# Patient Record
Sex: Male | Born: 1990 | Race: Asian | Hispanic: No | Marital: Single | Smoking: Former smoker
Health system: Southern US, Community
[De-identification: ages and names within clinical notes are randomized; demographics above are authoritative.]

## PROBLEM LIST (undated history)

## (undated) DIAGNOSIS — T7840XA Allergy, unspecified, initial encounter: Secondary | ICD-10-CM

## (undated) HISTORY — DX: Allergy, unspecified, initial encounter: T78.40XA

---
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
## 2013-05-01 HISTORY — PX: URETERAL EXPLORATION: SHX2609

## 2017-10-10 ENCOUNTER — Ambulatory Visit: Payer: Managed Care, Other (non HMO) | Admitting: Surgery

## 2017-10-10 ENCOUNTER — Encounter: Payer: Self-pay | Admitting: Surgery

## 2017-10-10 DIAGNOSIS — R1031 Right lower quadrant pain: Secondary | ICD-10-CM

## 2017-10-10 NOTE — Patient Instructions (Signed)
CT abdomen and pelvis Wednesday 10-17-17 at 8:00 Nothing to eat or drink 4 hours before CT scan arrive at 7:[address] [facility] for 8:00 scan Follow up appointment as scheduled

## 2017-10-10 NOTE — Progress Notes (Signed)
Derrick NielsenFrank Castaneda is an 27 y.o. male.   Chief Complaint: Right lower quadrant abdominal pain Consult requested by [facility] HPI: This a patient who was sneezing a couple of months ago and noticed pain and bulge in his right lower quadrant.  He had an open appendectomy at age 27 and has quite a large scar in that area.  He has had no nausea vomiting fevers or chills and did not know that he had a hernia although he gas that that might be the case.  He works as a Presenter, broadcastingcarpet install coordinator.  He stopped smoking 6 months ago does not drink alcohol.  He has no family history of serious medical illnesses except diabetes.  Past Medical History:  Diagnosis Date  . Allergy    seasonal      Family History  Problem Relation Age of Onset  . Diabetes Maternal Grandmother   . Diabetes Paternal Grandmother   . Colon cancer Neg Hx    Social History:  reports that he quit smoking about 5 months ago. His smoking use included cigarettes. He has a 1.00 pack-year smoking history. He has never used smokeless tobacco. He reports that he drinks alcohol. He reports that he does not use drugs.  Allergies: No Known Allergies   (Not in a hospital admission)   Review of Systems:   Review of Systems  Constitutional: Negative.   HENT: Negative.   Eyes: Negative.   Respiratory: Negative.   Cardiovascular: Negative.   Gastrointestinal: Positive for abdominal pain. Negative for constipation, diarrhea, heartburn, nausea and vomiting.  Genitourinary: Negative.   Musculoskeletal: Negative.   Skin: Negative.   Neurological: Negative.   Endo/Heme/Allergies: Negative.   Psychiatric/Behavioral: Negative.     Physical Exam:  Physical Exam  Constitutional: He appears well-developed and well-nourished.  HENT:  Head: Normocephalic and atraumatic.  Abdominal: Normal appearance. He exhibits no ascites. There is no hepatosplenomegaly. There is tenderness in the right lower quadrant. There is no rigidity,  no guarding and no tenderness at McBurney's point.  Scarring right lower quadrant Patient examined supine and with Valsalva.  Question of a lateral hernia near the lateral extent of the scar. No palpable bulge but question of a hernial rent lateral.  Nontender  Vitals reviewed.   Blood pressure (!) 167/95, pulse 64, temperature 98.2 F (36.8 C), temperature source Oral, resp. rate 16, height 5\' 8"  (1.727 m), weight 215 lb (97.5 kg), SpO2 98 %.    No results found for this or any previous visit (from the past 48 hour(s)). No results found.   Assessment/Plan We will obtain CT scan to evaluate for possible hernia while the likelihood is high.  Lattie Hawichard E Cooper, MD, FACS

## 2017-10-17 ENCOUNTER — Ambulatory Visit
Admission: RE | Admit: 2017-10-17 | Discharge: 2017-10-17 | Disposition: A | Discharge status: Home / Self Care | Payer: Managed Care, Other (non HMO) | Source: Ambulatory Visit | Attending: Surgery | Admitting: Surgery

## 2017-10-17 DIAGNOSIS — R1031 Right lower quadrant pain: Secondary | ICD-10-CM

## 2017-10-17 MED ORDER — IOPAMIDOL (ISOVUE-300) INJECTION 61%
100.0000 mL | Freq: Once | INTRAVENOUS | Status: AC | PRN
  Administered 2017-10-17: 100 mL via INTRAVENOUS

## 2017-10-18 ENCOUNTER — Telehealth: Payer: Self-pay

## 2017-10-18 NOTE — Telephone Encounter (Signed)
Called patient but had to leave him a detailed message letting him know that his CT Scan did not show any abnormalities in his abdomen. However, I gave him the option to call us if he wanted to be seen by Dr. Excell Seltzerooper again to give him suggestions.

## 2017-10-23 ENCOUNTER — Ambulatory Visit (INDEPENDENT_AMBULATORY_CARE_PROVIDER_SITE_OTHER): Payer: Managed Care, Other (non HMO) | Admitting: Surgery

## 2017-10-23 VITALS — BP 150/88 | HR 59 | Temp 97.8°F | Ht 68.0 in | Wt 219.0 lb

## 2017-10-23 DIAGNOSIS — R1031 Right lower quadrant pain: Secondary | ICD-10-CM

## 2017-10-23 NOTE — Patient Instructions (Signed)
We will send a referral to [facility] [phone]. They will contact you with date and time of your appointment. Let them know that you would like [place] be [place] in [place].

## 2017-10-23 NOTE — Progress Notes (Signed)
Outpatient Surgical Follow Up  10/23/2017  Derrick Castaneda is an 27 y.o. male.   CC: Right lower quadrant abdominal pain  HPI: This a patient who had a an open appendectomy as a child who presents with right lower quadrant abdominal pain somewhat suggestive of a ventral hernia.  CT scan has been personally reviewed and fails to identify any hernia.  Also failed to identify any other abnormalities. Patient points to his appendectomy incision as the site of his pain.  He states sometimes it radiates up to his right upper quadrant near his ribs.  He has nausea but no emesis.  Past Medical History:  Diagnosis Date  . Allergy    seasonal      Family History  Problem Relation Age of Onset  . Diabetes Maternal Grandmother   . Diabetes Paternal Grandmother   . Colon cancer Neg Hx     Social History:  reports that he quit smoking about 5 months ago. His smoking use included cigarettes. He has a 1.00 pack-year smoking history. He has never used smokeless tobacco. He reports that he drinks alcohol. He reports that he does not use drugs.  Allergies: No Known Allergies  Medications reviewed.   Review of Systems:   Review of Systems  Constitutional: Negative.   Eyes: Negative.   Respiratory: Negative.   Cardiovascular: Negative.   Gastrointestinal: Positive for abdominal pain and nausea. Negative for constipation, diarrhea and vomiting.  Genitourinary: Negative.   Musculoskeletal: Negative.   Skin: Negative.      Physical Exam:  There were no vitals taken for this visit.  Physical Exam  Constitutional: He appears well-developed and well-nourished. No distress.  Pulmonary/Chest: Effort normal. No respiratory distress.  Abdominal: Soft. He exhibits no distension and no mass. There is no tenderness. There is no rebound and no guarding. No hernia.  Patient examined supine and with Valsalva.  No sign of obvious hernia and no tenderness in the appendectomy incision or in the right  upper quadrant.  Skin: Skin is warm and dry. He is not diaphoretic. No erythema.      No results found for this or any previous visit (from the past 48 hour(s)). No results found.  Assessment/Plan:  CT scan was personally reviewed.  There is no evidence of hernia.  No other abnormalities noted on the CT scan with contrast.  I see no clear evidence for a hernia although his history was suggestive of such.  I would like to obtain a colonoscopy and will ask him to see GI for consultation concerning possible colonoscopy.  Lattie Hawichard E Cooper, MD, FACS

## 2019-09-01 IMAGING — CT CT ABD-PELV W/ CM
2 of 4 series · 16 of 46 positions shown, 18 images · IV contrast (iopamidol)
Comparison: None.

CLINICAL DATA: Worsening right lower quadrant pain for 1 month

EXAM:
CT ABDOMEN AND PELVIS WITH CONTRAST
TECHNIQUE: Multidetector CT imaging of the abdomen and pelvis was performed
using the standard protocol following bolus administration of
intravenous contrast.
CONTRAST:  100mL SRYED1-ARR IOPAMIDOL (SRYED1-ARR) INJECTION 61%

[Series 2: abd pelvis · axial · 0.70mm/px · z∈[-1624,-1174]mm · 13 of 100 slices shown, 15 images (1 of 2)]
[im 5/100  soft-tissue]
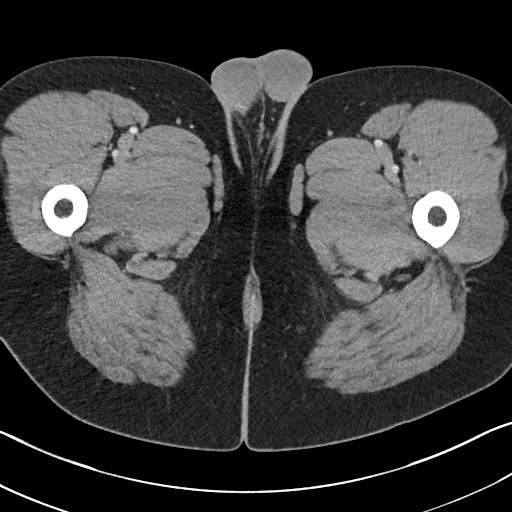
[im 5/100  bone]
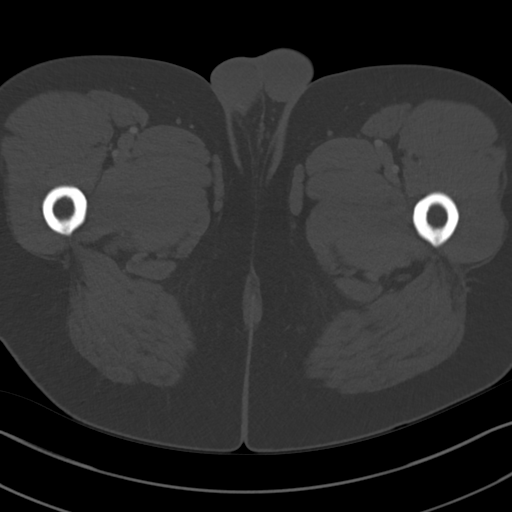
[im 13/100  soft-tissue]
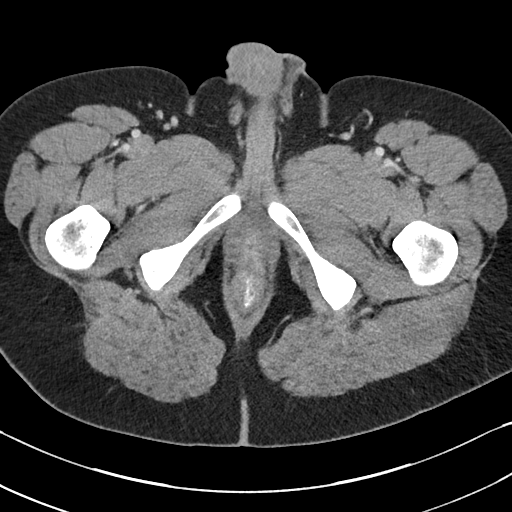
[im 21/100  soft-tissue]
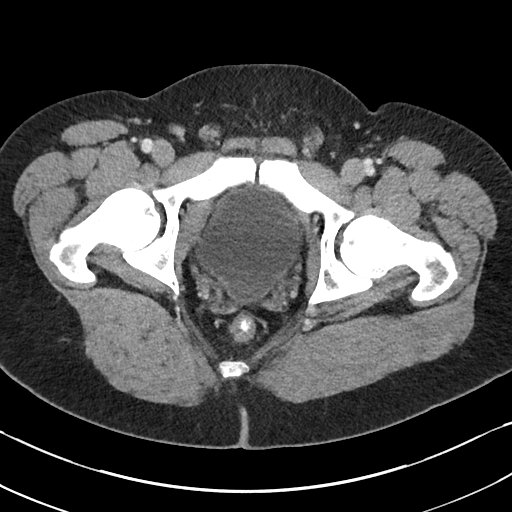
[im 29/100  soft-tissue]
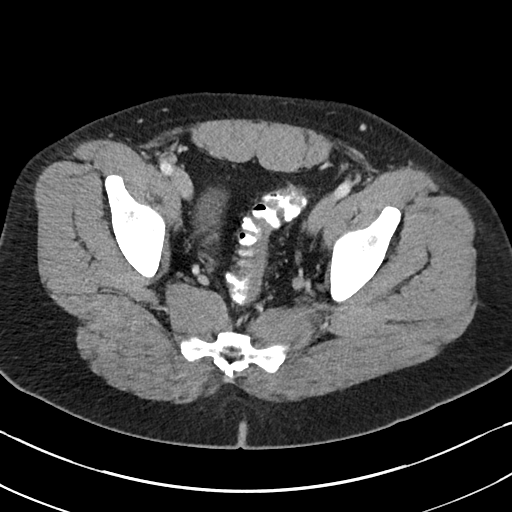
[im 34/100  soft-tissue]
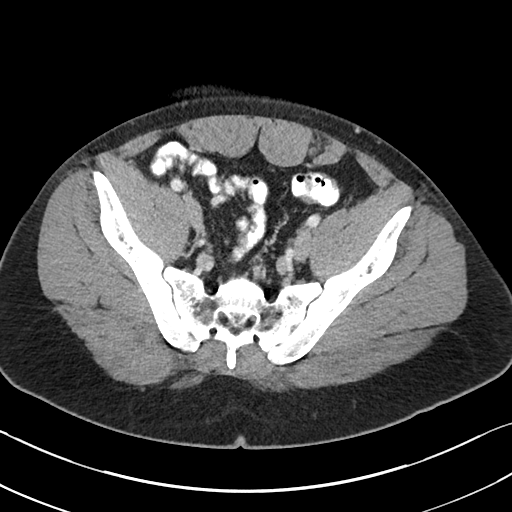
[im 42/100  soft-tissue]
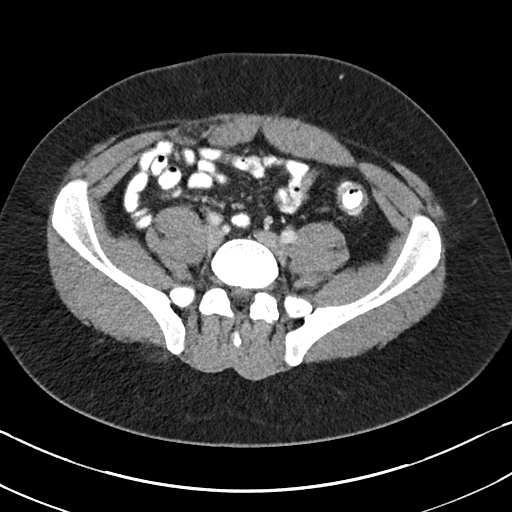
[im 50/100  soft-tissue]
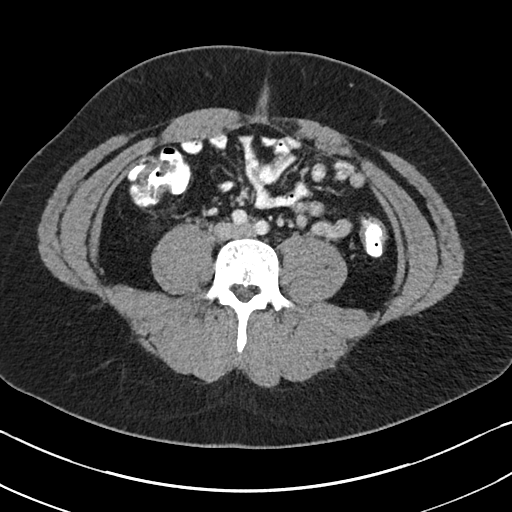
[im 58/100  soft-tissue]
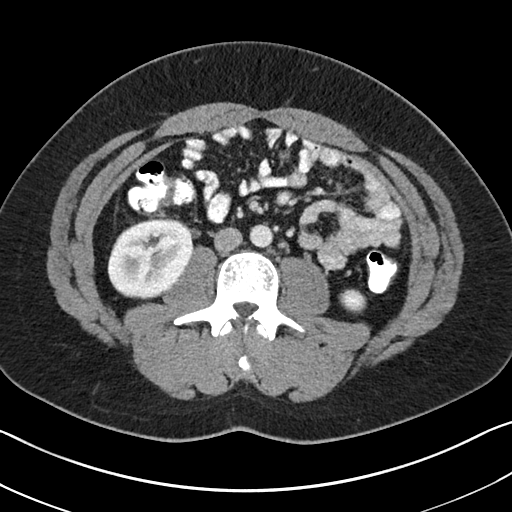
[im 67/100  soft-tissue]
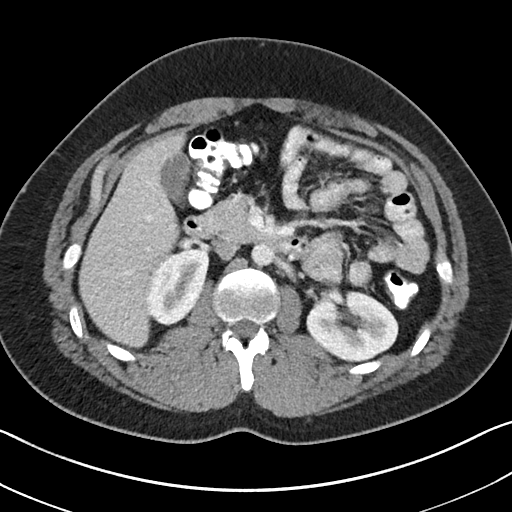
[im 67/100  bone]
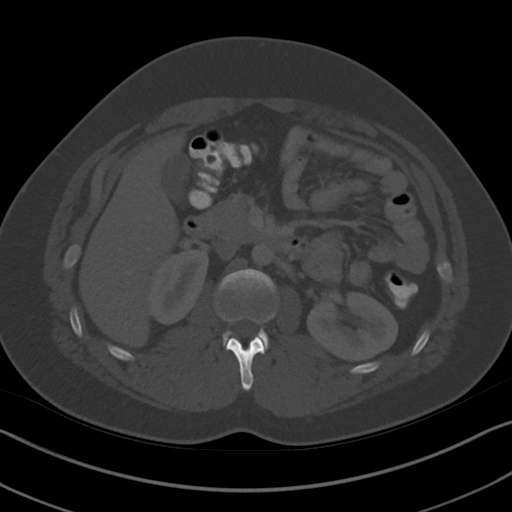
[im 71/100  soft-tissue]
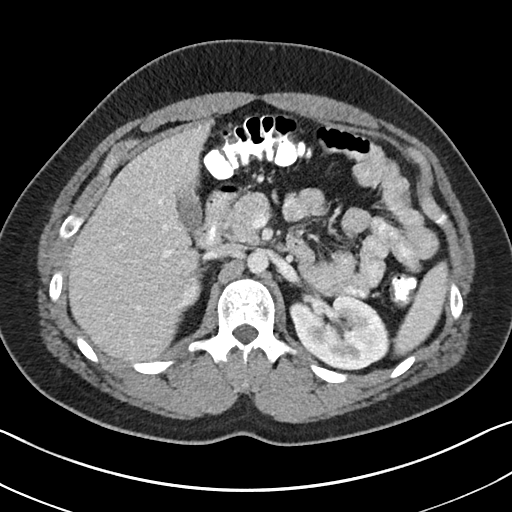
[im 79/100  soft-tissue]
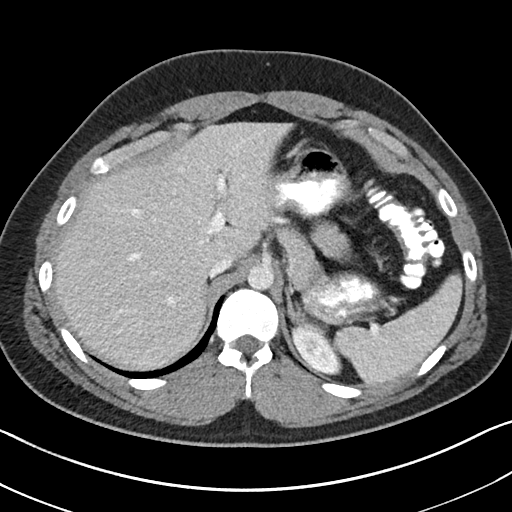
[im 87/100  soft-tissue]
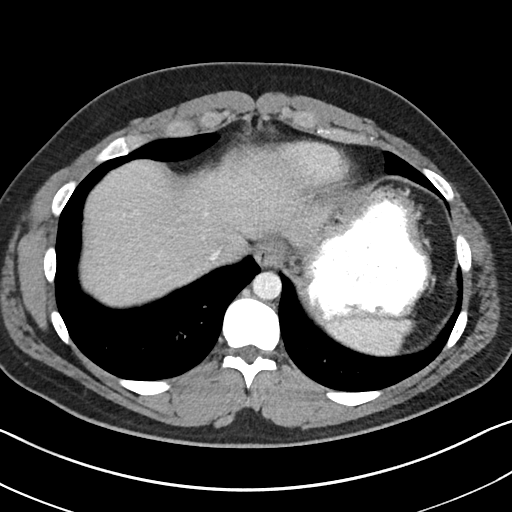
[im 95/100  soft-tissue]
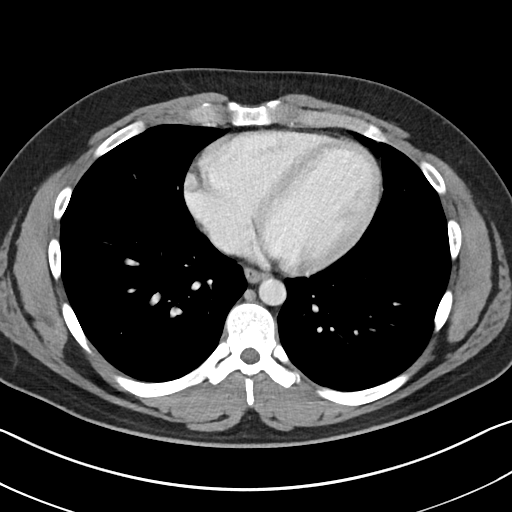

[Series 4: abd pelvis · coronal · 0.71mm/px · 3 of 148 slices shown (2 of 2)]
[im 50/148  soft-tissue]
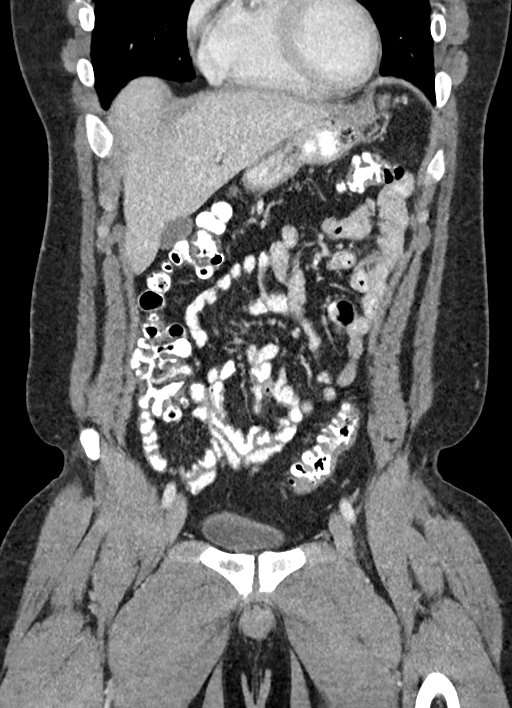
[im 66/148  soft-tissue]
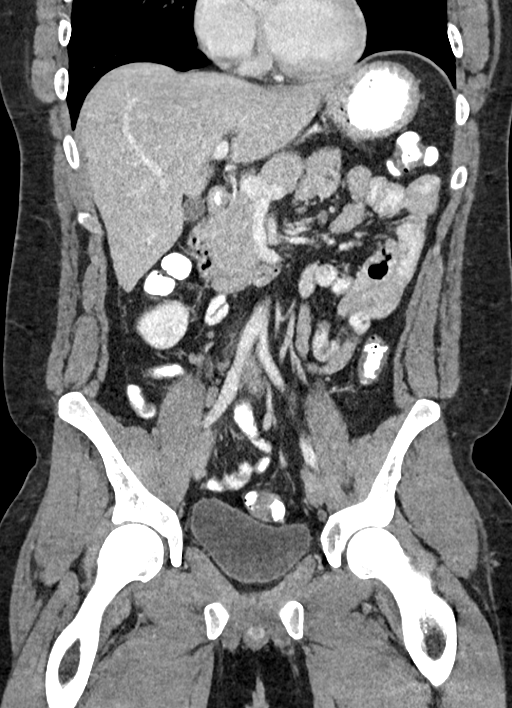
[im 82/148  soft-tissue]
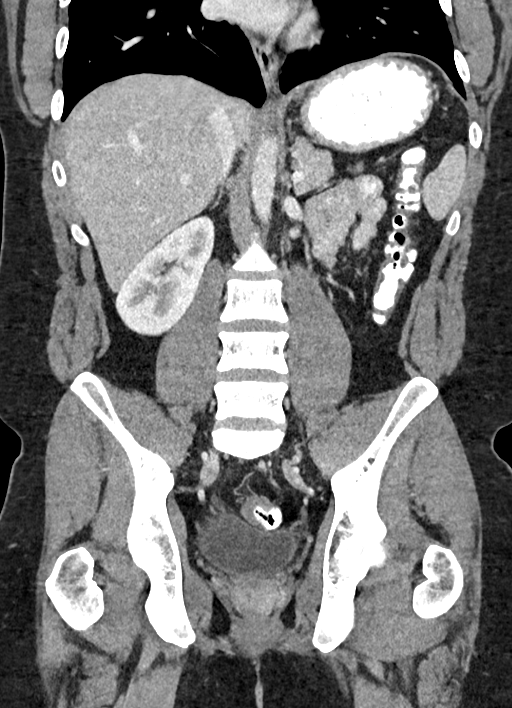

[16 of 46 positions shown; findings below may reference images not displayed]

FINDINGS: Lower chest: Lung bases are clear. No effusions. Heart is normal
size.

Hepatobiliary: No focal hepatic abnormality. Gallbladder
unremarkable.

Pancreas: No focal abnormality or ductal dilatation.

Spleen: No focal abnormality.  Normal size.

Adrenals/Urinary Tract: No adrenal abnormality. No focal renal
abnormality. No stones or hydronephrosis. Urinary bladder is
unremarkable.

Stomach/Bowel: Appendix not definitively seen. No pericecal
inflammation or abnormality noted in the right lower quadrant.
Stomach, large and small bowel grossly unremarkable.

Vascular/Lymphatic: No evidence of aneurysm or adenopathy.

Reproductive: No visible focal abnormality.

Other: No free fluid or free air.

Musculoskeletal: No acute bony abnormality.
IMPRESSION: Appendix not definitively seen, but no pericecal inflammation or
secondary signs of appendicitis.

No acute findings in the abdomen or pelvis.

## 2022-12-02 ENCOUNTER — Ambulatory Visit
Admission: EM | Admit: 2022-12-02 | Discharge: 2022-12-02 | Disposition: A | Discharge status: Home / Self Care | Payer: Managed Care, Other (non HMO) | Attending: Urgent Care | Admitting: Urgent Care

## 2022-12-02 ENCOUNTER — Ambulatory Visit (INDEPENDENT_AMBULATORY_CARE_PROVIDER_SITE_OTHER): Payer: 59

## 2022-12-02 DIAGNOSIS — S51011A Laceration without foreign body of right elbow, initial encounter: Secondary | ICD-10-CM

## 2022-12-02 DIAGNOSIS — F1020 Alcohol dependence, uncomplicated: Secondary | ICD-10-CM | POA: Insufficient documentation

## 2022-12-02 DIAGNOSIS — Z23 Encounter for immunization: Secondary | ICD-10-CM

## 2022-12-02 DIAGNOSIS — R319 Hematuria, unspecified: Secondary | ICD-10-CM | POA: Insufficient documentation

## 2022-12-02 DIAGNOSIS — Z0289 Encounter for other administrative examinations: Secondary | ICD-10-CM | POA: Insufficient documentation

## 2022-12-02 MED ORDER — MUPIROCIN 2 % EX OINT: 1.0000 | TOPICAL_OINTMENT | Freq: Three times a day (TID) | CUTANEOUS | 0 refills | Status: AC

## 2022-12-02 MED ORDER — TETANUS-DIPHTH-ACELL PERTUSSIS 5-2.5-18.5 LF-MCG/0.5 IM SUSY
0.5000 mL | PREFILLED_SYRINGE | Freq: Once | INTRAMUSCULAR | Status: AC
  Administered 2022-12-02: 0.5 mL via INTRAMUSCULAR

## 2022-12-02 NOTE — ED Provider Notes (Signed)
Renaldo Fiddler    CSN: [id number] Arrival date & time: 12/02/22  3244      History   Chief Complaint Chief Complaint  Patient presents with   Laceration    HPI Chason Mciver is a 32 y.o. male.   32yo male presents today with c/o laceration to his R elbow which occurred around 11pm last evening. States he tripped and fell into a large pile of gravel. Reports numerous scrapes and abrasions surrounding the lac. Denies any pain. Reports FROM of elbow, wrist, hand. Denies radicular symptoms. Cleaned it at home with hydrogen peroxide prior to presentation today. It is not bleeding. Last Tdap 9 years ago.   Laceration   Past Medical History:  Diagnosis Date   Allergy    seasonal    Patient Active Problem List   Diagnosis Date Noted   Health examination of defined subpopulation 12/02/2022   Blood in urine 12/02/2022   Alcohol dependence, uncomplicated (HCC) 12/02/2022    Past Surgical History:  Procedure Laterality Date   APPENDECTOMY  1996   [place]   URETERAL EXPLORATION  2015   repair tear       Home Medications    Prior to Admission medications   Medication Sig Start Date End Date Taking? Authorizing Provider  mupirocin ointment (BACTROBAN) 2 % Apply 1 Application topically 3 (three) times daily. 12/02/22  Yes Racquel Arkin L, PA  cetirizine (ZYRTEC) 10 MG tablet Take 10 mg by mouth as needed for allergies.    [provider]    Family History Family History  Problem Relation Age of Onset   Diabetes Maternal Grandmother    Diabetes Paternal Grandmother    Colon cancer Neg Hx     Social History Social History   Tobacco Use   Smoking status: Former    Current packs/day: 0.00    Average packs/day: 0.3 packs/day for 4.0 years (1.0 ttl pk-yrs)    Types: Cigarettes    Start date: 05/01/2013    Quit date: 05/01/2017    Years since quitting: 5.5   Smokeless tobacco: Never  Vaping Use   Vaping status: Never Used  Substance Use Topics    Alcohol use: Yes    Comment: occasionally   Drug use: Never     Allergies   Patient has no known allergies.   Review of Systems Review of Systems As per HPI  Physical Exam Triage Vital Signs ED Triage Vitals  Encounter Vitals Group     BP 12/02/22 0838 132/86     Systolic BP Percentile --      Diastolic BP Percentile --      Pulse Rate 12/02/22 0838 65     Resp 12/02/22 0838 16     Temp 12/02/22 0838 (!) 97.2 F (36.2 C)     Temp Source 12/02/22 0838 Temporal     SpO2 12/02/22 0838 98 %     Weight --      Height --      Head Circumference --      Peak Flow --      Pain Score 12/02/22 0832 0     Pain Loc --      Pain Education --      Exclude from Growth Chart --    No data found.  Updated Vital Signs BP 132/86 (BP Location: Left Arm)   Pulse 65   Temp (!) 97.2 F (36.2 C) (Temporal)   Resp 16   SpO2 98%   Visual  Acuity Right Eye Distance:   Left Eye Distance:   Bilateral Distance:    Right Eye Near:   Left Eye Near:    Bilateral Near:     Physical Exam Vitals and nursing note reviewed. Exam conducted with a chaperone present.  Constitutional:      General: He is not in acute distress.    Appearance: Normal appearance. He is normal weight. He is not ill-appearing, toxic-appearing or diaphoretic.  HENT:     Head: Normocephalic and atraumatic.  Cardiovascular:     Rate and Rhythm: Normal rate.  Pulmonary:     Effort: Pulmonary effort is normal. No respiratory distress.  Musculoskeletal:        General: Signs of injury present. No swelling. Normal range of motion.       Arms:     Right lower leg: No edema.     Left lower leg: No edema.  Skin:    General: Skin is warm.     Capillary Refill: Capillary refill takes less than 2 seconds.  Neurological:     General: No focal deficit present.     Mental Status: He is alert and oriented to person, place, and time.     Sensory: No sensory deficit.     Motor: No weakness.  Psychiatric:        Mood  and Affect: Mood normal.        Behavior: Behavior normal.      UC Treatments / Results  Labs (all labs ordered are listed, but only abnormal results are displayed) Labs Reviewed - No data to display  EKG   Radiology DG Elbow Complete Right  Result Date: 12/02/2022 CLINICAL DATA:  Laceration, rule out foreign body EXAM: RIGHT ELBOW - COMPLETE 3+ VIEW COMPARISON:  None Available. FINDINGS: There is no evidence of fracture, dislocation, or joint effusion. There is no evidence of arthropathy or other focal bone abnormality. Soft tissues are unremarkable. IMPRESSION: No fracture or dislocation of the right elbow. No radiopaque foreign body. Electronically Signed   By: Jearld Lesch M.D.   On: 12/02/2022 09:16    Procedures Laceration Repair  Date/Time: 12/02/2022 9:30 AM  Performed by: Maretta Bees, PA Authorized by: Maretta Bees, PA   Consent:    Consent obtained:  Verbal   Consent given by:  Patient   Risks, benefits, and alternatives were discussed: yes     Risks discussed:  Infection, pain, retained foreign body, tendon damage, vascular damage, poor wound healing, poor cosmetic result, need for additional repair and nerve damage   Alternatives discussed:  No treatment, delayed treatment and observation Universal protocol:    Procedure explained and questions answered to patient or proxy's satisfaction: yes     Imaging studies available: yes     Site/side marked: yes     Patient identity confirmed:  Verbally with patient Anesthesia:    Anesthesia method:  Local infiltration   Local anesthetic:  Lidocaine 2% w/o epi Laceration details:    Location:  Shoulder/arm   Shoulder/arm location:  R elbow   Length (cm):  4   Depth (mm):  3 Pre-procedure details:    Preparation:  Patient was prepped and draped in usual sterile fashion and imaging obtained to evaluate for foreign bodies Exploration:    Limited defect created (wound extended): yes     Imaging obtained: x-ray      Imaging outcome: foreign body not noted     Wound exploration: wound explored through full range  of motion and entire depth of wound visualized     Wound extent: no fascia violation noted, no foreign bodies/material noted, no muscle damage noted, no nerve damage noted, no tendon damage noted, no underlying fracture noted and no vascular damage noted     Contaminated: no   Treatment:    Area cleansed with:  Povidone-iodine and saline   Amount of cleaning:  Standard   Irrigation solution:  Sterile water and sterile saline   Irrigation method:  Syringe   Debridement:  None Skin repair:    Repair method:  Sutures   Suture size:  3-0   Suture material:  Prolene   Suture technique:  Simple interrupted   Number of sutures:  5 Approximation:    Approximation:  Close Repair type:    Repair type:  Simple Post-procedure details:    Dressing:  Antibiotic ointment, non-adherent dressing and bulky dressing Comments:     Did not have immobilizer in stock, so used a bulky chux pad, coban and ace wrap around R arm/ elbow to prevent excessive ROM  (including critical care time)  Medications Ordered in UC Medications  Tdap (BOOSTRIX) injection 0.5 mL (0.5 mLs Intramuscular Given 12/02/22 0841)    Initial Impression / Assessment and Plan / UC Course  I have reviewed the triage vital signs and the nursing notes.  Pertinent labs & imaging results that were available during my care of the patient were reviewed by me and considered in my medical decision making (see chart for details).     Elbow laceration R - 5 sutures placed to right elbow. Good approximation achieved, patient tolerated well. Xray negative for fx or FB. Pt placed in dressing to prevent excessive ROM, pt understands that movement of elbow will cause extra strain/ tension on sutures preventing wound healing. Pt instructed to leave wound dry minimum of 48 hours, change dressing tomorrow morning, then leave open at night. S/sx of  infection reviewed. RTC 7 days for suture removal Tdap - updated in clinic today.  Final Clinical Impressions(s) / UC Diagnoses   Final diagnoses:  Elbow laceration, right, initial encounter  Need for prophylactic vaccination with combined diphtheria-tetanus-pertussis (DTP) vaccine     Discharge Instructions      Please keep your wound dry for a minimum of 48 hours. Change today's dressing tomorrow morning. Place the antibiotic ointment prescribed today over the laceration and all open abrasion areas up to 3 times daily. Tomorrow evening, Sunday night, leave open at night, and only wrap during the day. Please continue to make sure that your right arm stays as straight as possible, to prevent excessive tension on the elbow. Monitor for signs of infection which includes redness, warmth, drainage, or swelling. You may take up to 800 mg of ibuprofen every 8 hours with food as needed for pain.  Your tetanus vaccine was updated today.  Please return in 7 days for a possible suture removal. Please read and follow all laceration care instructions attached to this page.     ED Prescriptions     Medication Sig Dispense Auth. Provider   mupirocin ointment (BACTROBAN) 2 % Apply 1 Application topically 3 (three) times daily. 22 g Janeann Paisley L, [place]      PDMP not reviewed this encounter.   Maretta Bees, [place] 12/02/22 1219

## 2022-12-02 NOTE — Discharge Instructions (Addendum)
Please keep your wound dry for a minimum of 48 hours. Change today's dressing tomorrow morning. Place the antibiotic ointment prescribed today over the laceration and all open abrasion areas up to 3 times daily. Tomorrow evening, Sunday night, leave open at night, and only wrap during the day. Please continue to make sure that your right arm stays as straight as possible, to prevent excessive tension on the elbow. Monitor for signs of infection which includes redness, warmth, drainage, or swelling. You may take up to 800 mg of ibuprofen every 8 hours with food as needed for pain.  Your tetanus vaccine was updated today.  Please return in 7 days for a possible suture removal. Please read and follow all laceration care instructions attached to this page.

## 2022-12-02 NOTE — ED Triage Notes (Signed)
Patient presents to [facility] for elbow laceration yesterday evening approximately 2300. Last Tdap 2015. Cleansed with hydrogen peroxide and applied neosporin.
